# Patient Record
Sex: Female | Born: 1990 | Race: White | Hispanic: No | Marital: Single
Health system: Southern US, Community
[De-identification: ages and names within clinical notes are randomized; demographics above are authoritative.]

---

## 2011-05-08 ENCOUNTER — Emergency Department: Payer: Self-pay | Admitting: Emergency Medicine

## 2011-05-08 LAB — URINALYSIS, COMPLETE
Blood: NEGATIVE
Leukocyte Esterase: NEGATIVE
Nitrite: NEGATIVE
Ph: 5 (ref 4.5–8.0)
Protein: 30
Specific Gravity: 1.025 (ref 1.003–1.030)

## 2012-07-14 ENCOUNTER — Ambulatory Visit (INDEPENDENT_AMBULATORY_CARE_PROVIDER_SITE_OTHER): Payer: Self-pay | Admitting: Internal Medicine

## 2012-07-14 ENCOUNTER — Encounter: Payer: Self-pay | Admitting: Internal Medicine

## 2012-07-14 DIAGNOSIS — Z789 Other specified health status: Secondary | ICD-10-CM

## 2012-07-14 DIAGNOSIS — Z23 Encounter for immunization: Secondary | ICD-10-CM

## 2012-07-14 MED ORDER — ATOVAQUONE-PROGUANIL HCL 250-100 MG PO TABS: 1.0000 | ORAL_TABLET | Freq: Every day | ORAL | Status: AC

## 2012-07-14 MED ORDER — CIPROFLOXACIN HCL 500 MG PO TABS: 500.0000 mg | ORAL_TABLET | Freq: Two times a day (BID) | ORAL | Status: AC

## 2012-07-14 MED ORDER — TYPHOID VACCINE PO CPDR: 1.0000 | DELAYED_RELEASE_CAPSULE | ORAL | Status: AC

## 2012-07-14 NOTE — Progress Notes (Signed)
[facility] TRAVEL [facility] NOTE  RFV: travel trip for [place]  Subjective:    Patient ID: Diamond Goodwin, female    DOB: 02/18/1991, 22 y.o.   MRN: [id number]  HPI Diamond Goodwin going with her family to [place], from July 20th thru Aug 5th for family vacation. She is senior at [employer] business  All: [employer]  Meds: YAZ birth control  Pmhx: none   Imm: has had hep a, hep b, and typhoid, (expired on 2009), MMR, dpt 2007, meningococcal 2009, only 4 doses of OPV  Prior travel hx: lived in [facility] for 10-12 yr, [place], [place], [address], [place], [place], [place], [place], [place], [place], [place], [place], [place], [place], [place], [place], [place], [place], [place], [place].        Review of Systems     Objective:   Physical Exam        Assessment & Plan:  Provided travel counseling plus:  Traveler's diarrhea = give rx for cipro 500mg  BID(#20) NR  Malaria proph = malarone #26 tabs to start July 18th take daily until 1 wk return  Pre travel vaccinations = she will take the oral vivotif and also receive Yellow fever today in clinic

## 2013-05-16 IMAGING — CR DG RIBS 2V*L*
1 series · 4 of 4 positions shown · non-contrast
Comparison: none

REASON FOR EXAM: pain.
COMMENTS:

PROCEDURE:     DXR - DXR RIBS LEFT UNILATERAL  - May 08, 2011  [DATE]
RESULT:     Comparison: None

[Series 1: ap · 0.17mm/px · 4 of 4 slices shown]
[im 1/4]
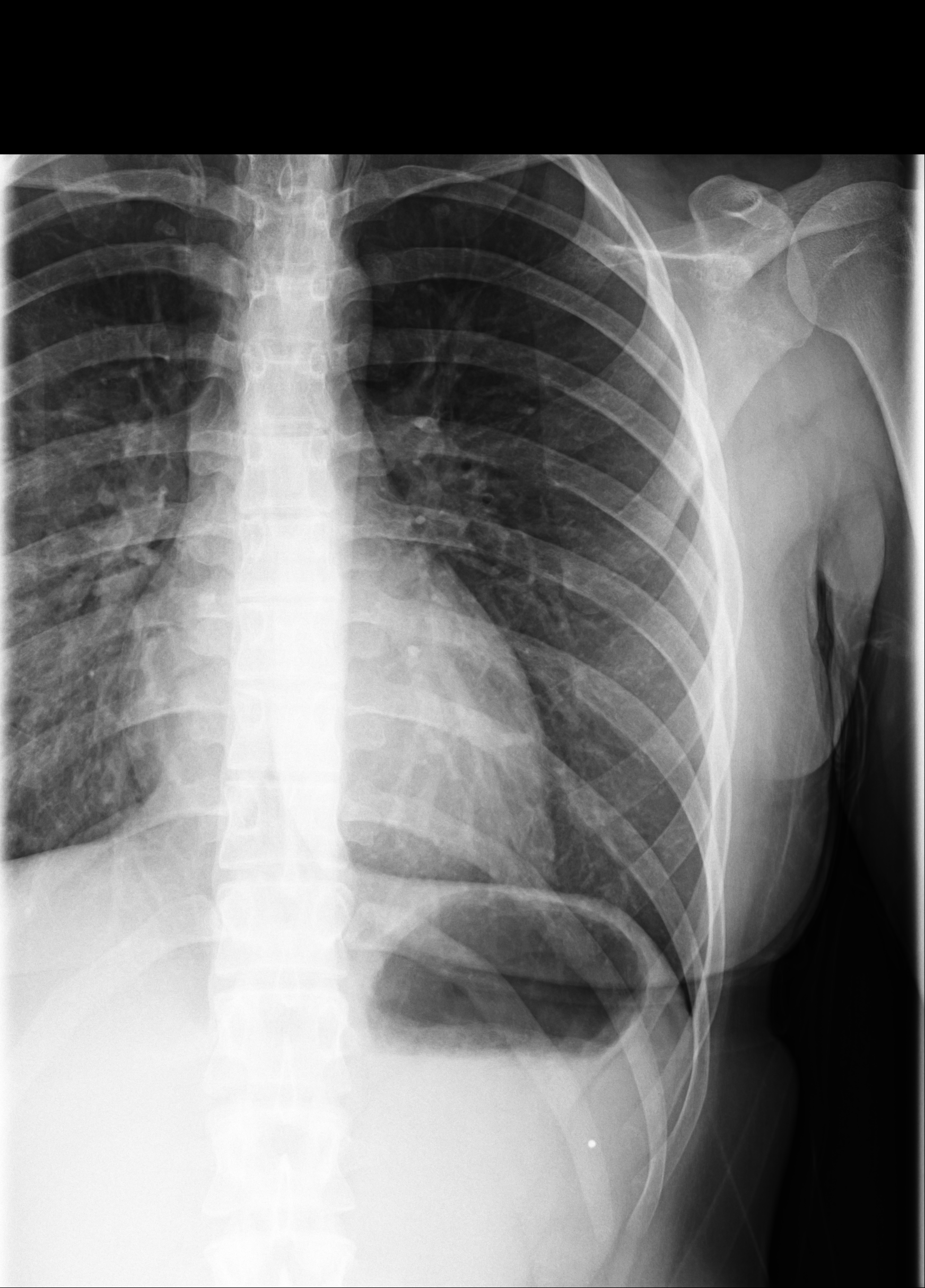
[im 2/4]
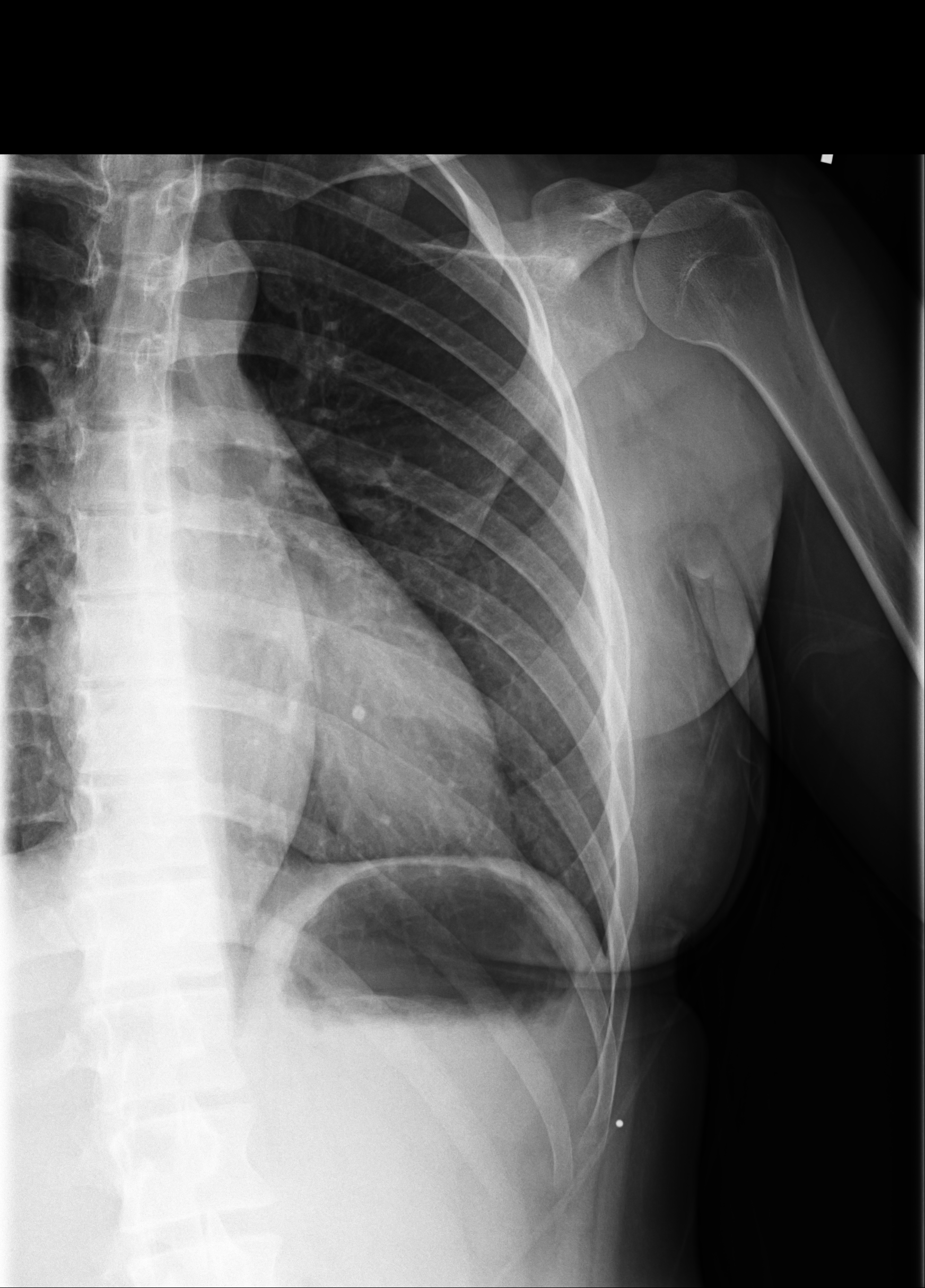
[im 3/4]
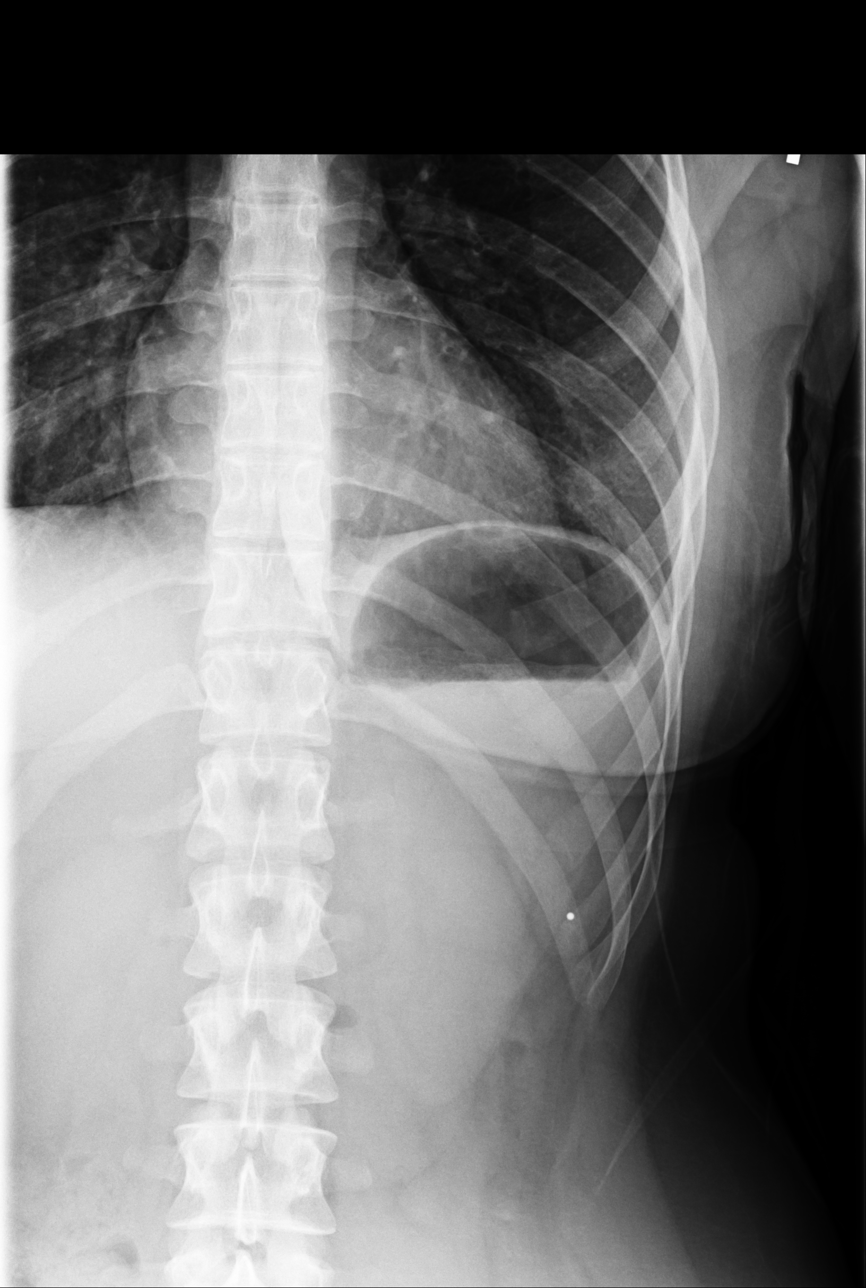
[im 4/4]
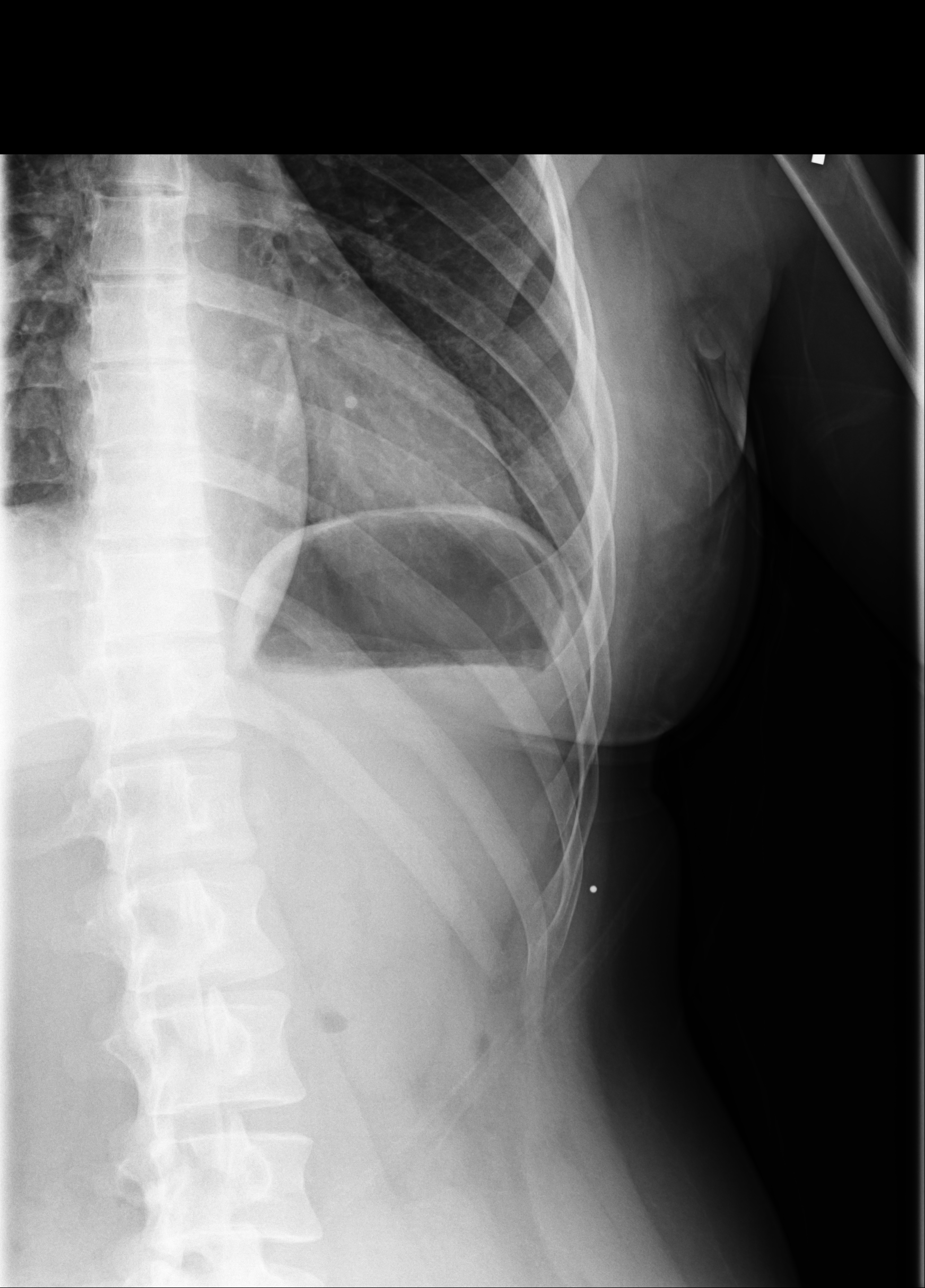

[4 of 4 positions shown; findings below may reference images not displayed]

FINDINGS: 4 views of the left ribs demonstrates no rib fracture or rib
deformity. There is no pleural reaction.
IMPRESSION: No acute osseous injury of the left ribs.

## 2018-02-28 DIAGNOSIS — M545 Low back pain: Secondary | ICD-10-CM | POA: Diagnosis not present

## 2018-02-28 DIAGNOSIS — M79606 Pain in leg, unspecified: Secondary | ICD-10-CM | POA: Diagnosis not present

## 2018-03-21 DIAGNOSIS — M545 Low back pain: Secondary | ICD-10-CM | POA: Diagnosis not present

## 2018-03-21 DIAGNOSIS — M79606 Pain in leg, unspecified: Secondary | ICD-10-CM | POA: Diagnosis not present

## 2018-03-28 DIAGNOSIS — M79606 Pain in leg, unspecified: Secondary | ICD-10-CM | POA: Diagnosis not present

## 2018-03-28 DIAGNOSIS — M545 Low back pain: Secondary | ICD-10-CM | POA: Diagnosis not present

## 2018-08-07 DIAGNOSIS — M25559 Pain in unspecified hip: Secondary | ICD-10-CM | POA: Diagnosis not present

## 2018-08-07 DIAGNOSIS — M79662 Pain in left lower leg: Secondary | ICD-10-CM | POA: Diagnosis not present

## 2018-08-07 DIAGNOSIS — M79661 Pain in right lower leg: Secondary | ICD-10-CM | POA: Diagnosis not present

## 2018-09-27 DIAGNOSIS — M79662 Pain in left lower leg: Secondary | ICD-10-CM | POA: Diagnosis not present

## 2018-09-27 DIAGNOSIS — M25559 Pain in unspecified hip: Secondary | ICD-10-CM | POA: Diagnosis not present

## 2018-09-27 DIAGNOSIS — M79661 Pain in right lower leg: Secondary | ICD-10-CM | POA: Diagnosis not present

## 2019-01-02 DIAGNOSIS — Z01419 Encounter for gynecological examination (general) (routine) without abnormal findings: Secondary | ICD-10-CM | POA: Diagnosis not present

## 2019-01-02 DIAGNOSIS — Z309 Encounter for contraceptive management, unspecified: Secondary | ICD-10-CM | POA: Diagnosis not present

## 2019-01-02 DIAGNOSIS — L659 Nonscarring hair loss, unspecified: Secondary | ICD-10-CM | POA: Diagnosis not present

## 2019-02-01 DIAGNOSIS — M545 Low back pain: Secondary | ICD-10-CM | POA: Diagnosis not present

## 2019-02-01 DIAGNOSIS — M25559 Pain in unspecified hip: Secondary | ICD-10-CM | POA: Diagnosis not present

## 2019-02-06 DIAGNOSIS — M25559 Pain in unspecified hip: Secondary | ICD-10-CM | POA: Diagnosis not present

## 2019-02-06 DIAGNOSIS — M545 Low back pain: Secondary | ICD-10-CM | POA: Diagnosis not present

## 2019-02-13 DIAGNOSIS — L659 Nonscarring hair loss, unspecified: Secondary | ICD-10-CM | POA: Diagnosis not present

## 2019-02-13 DIAGNOSIS — R7989 Other specified abnormal findings of blood chemistry: Secondary | ICD-10-CM | POA: Diagnosis not present

## 2019-03-06 ENCOUNTER — Other Ambulatory Visit: Payer: Self-pay | Admitting: Obstetrics & Gynecology

## 2019-03-07 ENCOUNTER — Other Ambulatory Visit: Payer: Self-pay | Admitting: Obstetrics & Gynecology

## 2019-03-07 DIAGNOSIS — R7989 Other specified abnormal findings of blood chemistry: Secondary | ICD-10-CM

## 2019-04-19 ENCOUNTER — Other Ambulatory Visit: Payer: Self-pay | Admitting: Obstetrics & Gynecology

## 2019-04-20 ENCOUNTER — Ambulatory Visit
Admission: RE | Admit: 2019-04-20 | Discharge: 2019-04-20 | Disposition: A | Discharge status: Home / Self Care | Payer: BC Managed Care – PPO | Source: Ambulatory Visit | Attending: Obstetrics & Gynecology | Admitting: Obstetrics & Gynecology

## 2019-04-20 DIAGNOSIS — Z0389 Encounter for observation for other suspected diseases and conditions ruled out: Secondary | ICD-10-CM | POA: Diagnosis not present

## 2019-04-20 DIAGNOSIS — R7989 Other specified abnormal findings of blood chemistry: Secondary | ICD-10-CM

## 2019-04-20 DIAGNOSIS — R935 Abnormal findings on diagnostic imaging of other abdominal regions, including retroperitoneum: Secondary | ICD-10-CM | POA: Diagnosis not present

## 2019-04-20 MED ORDER — IOPAMIDOL (ISOVUE-300) INJECTION 61%
100.0000 mL | Freq: Once | INTRAVENOUS | Status: AC | PRN
  Administered 2019-04-20: 14:00:00 100 mL via INTRAVENOUS

## 2019-05-23 DIAGNOSIS — J029 Acute pharyngitis, unspecified: Secondary | ICD-10-CM | POA: Diagnosis not present

## 2019-05-23 DIAGNOSIS — Z20828 Contact with and (suspected) exposure to other viral communicable diseases: Secondary | ICD-10-CM | POA: Diagnosis not present

## 2019-05-24 ENCOUNTER — Ambulatory Visit: Payer: BC Managed Care – PPO | Attending: Family

## 2019-05-24 DIAGNOSIS — R635 Abnormal weight gain: Secondary | ICD-10-CM | POA: Diagnosis not present

## 2019-05-24 DIAGNOSIS — Z23 Encounter for immunization: Secondary | ICD-10-CM

## 2019-05-24 DIAGNOSIS — L659 Nonscarring hair loss, unspecified: Secondary | ICD-10-CM | POA: Diagnosis not present

## 2019-05-24 NOTE — Progress Notes (Signed)
   Covid-19 Vaccination Clinic  Name:  Diamond Goodwin    MRN: [id number] DOB: [birth year]  05/24/2019  [name] was observed post Covid-19 immunization for 15 minutes without incident. She was provided with Vaccine Information Sheet and instruction to access the V-Safe system.   [name] was instructed to call 911 with any severe reactions post vaccine: Marland Kitchen Difficulty breathing  . Swelling of face and throat  . A fast heartbeat  . A bad rash all over body  . Dizziness and weakness   Immunizations Administered    Name Date Dose VIS Date Route   Moderna COVID-19 Vaccine 05/24/2019  3:12 PM 0.5 mL 01/23/2019 Intramuscular   Manufacturer: Moderna   Lot: [id number]   NDC: 14782-956-21

## 2019-05-29 DIAGNOSIS — M25561 Pain in right knee: Secondary | ICD-10-CM | POA: Diagnosis not present

## 2019-05-29 DIAGNOSIS — M25551 Pain in right hip: Secondary | ICD-10-CM | POA: Diagnosis not present

## 2019-05-29 DIAGNOSIS — M25552 Pain in left hip: Secondary | ICD-10-CM | POA: Diagnosis not present

## 2019-06-12 DIAGNOSIS — M25551 Pain in right hip: Secondary | ICD-10-CM | POA: Diagnosis not present

## 2019-06-12 DIAGNOSIS — M25561 Pain in right knee: Secondary | ICD-10-CM | POA: Diagnosis not present

## 2019-06-12 DIAGNOSIS — M25552 Pain in left hip: Secondary | ICD-10-CM | POA: Diagnosis not present

## 2019-06-26 ENCOUNTER — Ambulatory Visit: Payer: BC Managed Care – PPO | Attending: Family

## 2019-06-26 DIAGNOSIS — Z23 Encounter for immunization: Secondary | ICD-10-CM

## 2019-06-26 NOTE — Progress Notes (Signed)
   Covid-19 Vaccination Clinic  Name:  Diamond Goodwin    MRN: [id number] DOB: 1990/12/04  06/26/2019  Diamond Goodwin was observed post Covid-19 immunization for 15 minutes without incident. She was provided with Vaccine Information Sheet and instruction to access the V-Safe system.   Diamond Goodwin was instructed to call 911 with any severe reactions post vaccine: Marland Kitchen Difficulty breathing  . Swelling of face and throat  . A fast heartbeat  . A bad rash all over body  . Dizziness and weakness   Immunizations Administered    Name Date Dose VIS Date Route   Moderna COVID-19 Vaccine 06/26/2019  3:04 PM 0.5 mL 01/2019 Intramuscular   Manufacturer: Moderna   Lot: [id number]   NDC: 30092-330-07

## 2019-10-02 DIAGNOSIS — M25561 Pain in right knee: Secondary | ICD-10-CM | POA: Diagnosis not present

## 2019-10-02 DIAGNOSIS — M25552 Pain in left hip: Secondary | ICD-10-CM | POA: Diagnosis not present

## 2019-10-02 DIAGNOSIS — M25551 Pain in right hip: Secondary | ICD-10-CM | POA: Diagnosis not present

## 2019-10-18 DIAGNOSIS — D225 Melanocytic nevi of trunk: Secondary | ICD-10-CM | POA: Diagnosis not present

## 2019-10-18 DIAGNOSIS — D2270 Melanocytic nevi of unspecified lower limb, including hip: Secondary | ICD-10-CM | POA: Diagnosis not present

## 2019-10-18 DIAGNOSIS — D2360 Other benign neoplasm of skin of unspecified upper limb, including shoulder: Secondary | ICD-10-CM | POA: Diagnosis not present

## 2019-10-18 DIAGNOSIS — L821 Other seborrheic keratosis: Secondary | ICD-10-CM | POA: Diagnosis not present

## 2020-01-11 DIAGNOSIS — M25552 Pain in left hip: Secondary | ICD-10-CM | POA: Diagnosis not present

## 2020-01-11 DIAGNOSIS — M25551 Pain in right hip: Secondary | ICD-10-CM | POA: Diagnosis not present

## 2020-01-11 DIAGNOSIS — M25561 Pain in right knee: Secondary | ICD-10-CM | POA: Diagnosis not present

## 2021-04-20 DIAGNOSIS — J019 Acute sinusitis, unspecified: Secondary | ICD-10-CM | POA: Diagnosis not present

## 2021-04-28 IMAGING — CT CT ABD-PEL WO/W CM
3 of 10 series · 13 of 46 positions shown, 14 images · IV contrast (iopamidol)
Comparison: None.

CLINICAL DATA: Possible adrenal mass, hair thinning, high
testosterone

EXAM:
CT ABDOMEN AND PELVIS WITHOUT AND WITH CONTRAST
TECHNIQUE: Multidetector CT imaging of the abdomen and pelvis was performed
following the standard protocol before and following the bolus
administration of intravenous contrast.
CONTRAST:  100mL [id number] IOPAMIDOL ([id number]) INJECTION 61%

[Series 7: adrenals portal venous 3.00 br40 s3 axial cm · axial · portal-venous · 0.53mm/px · z∈[+1129,+1501]mm · 8 of 160 slices shown, 9 images]
[im 18/160  soft-tissue]
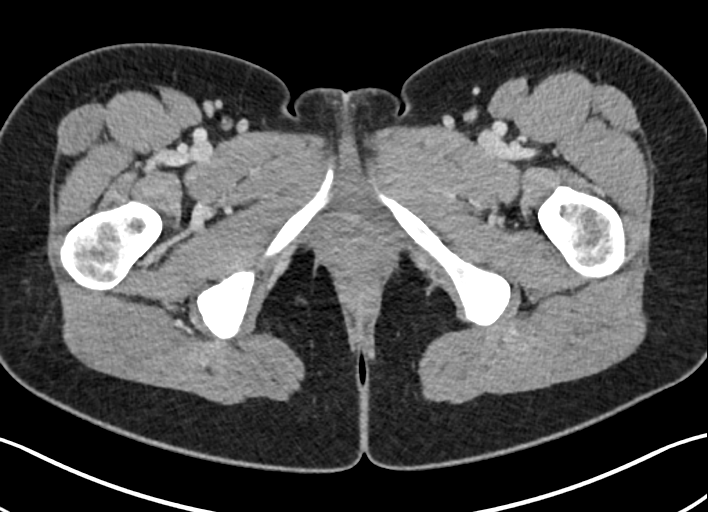
[im 18/160  bone]
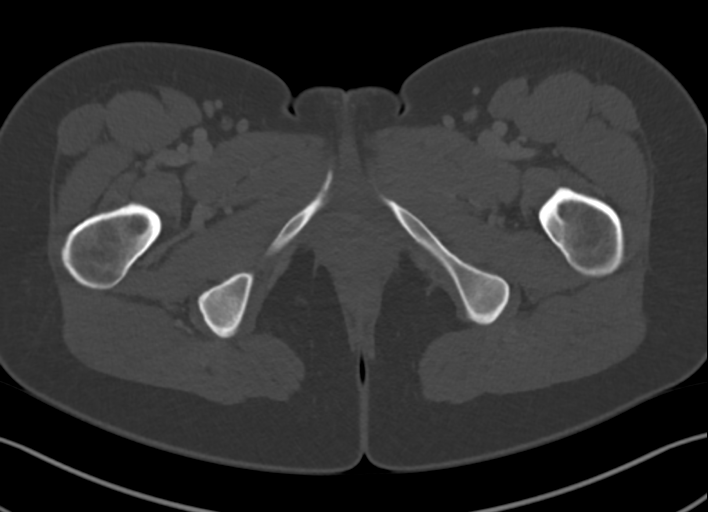
[im 36/160  soft-tissue]
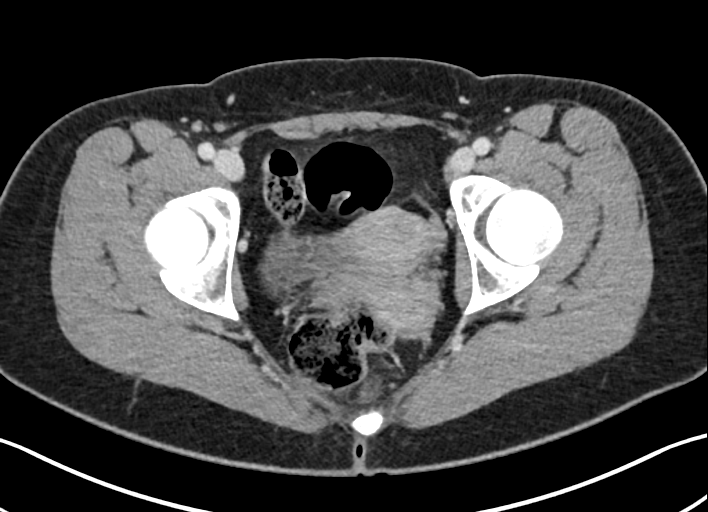
[im 54/160  soft-tissue]
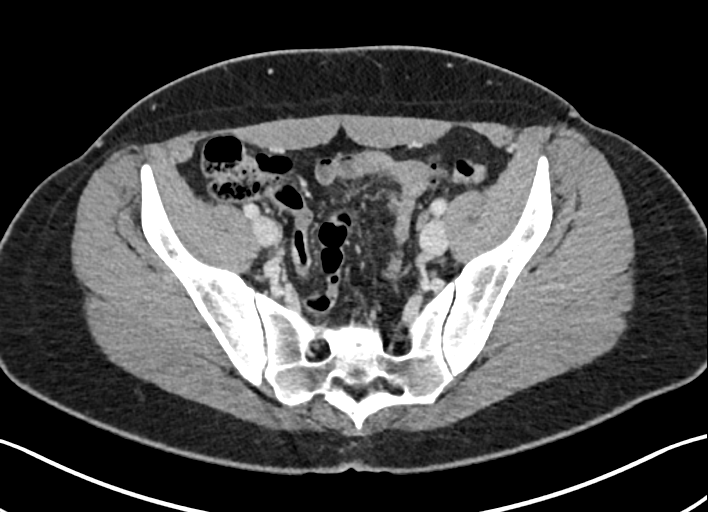
[im 71/160  soft-tissue]
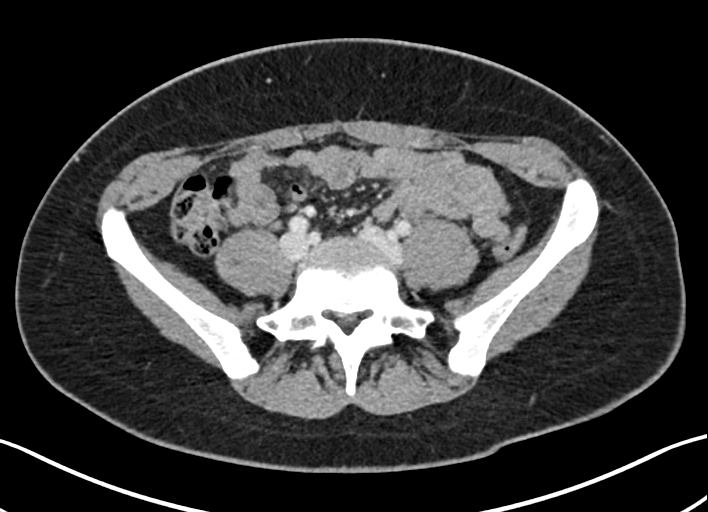
[im 89/160  soft-tissue]
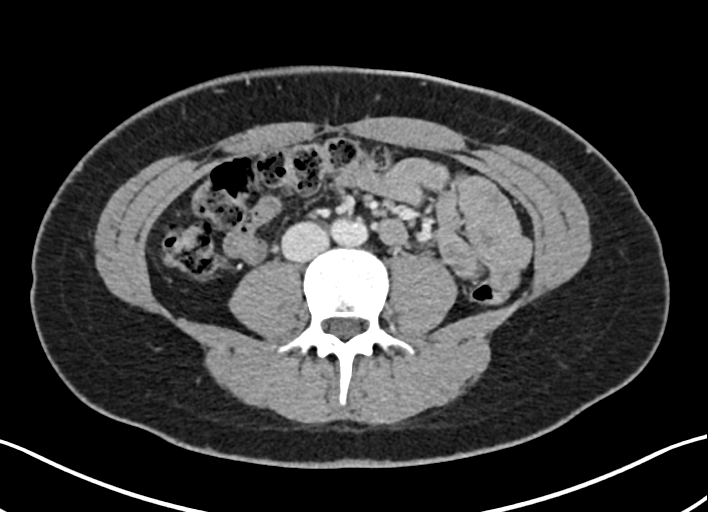
[im 107/160  soft-tissue]
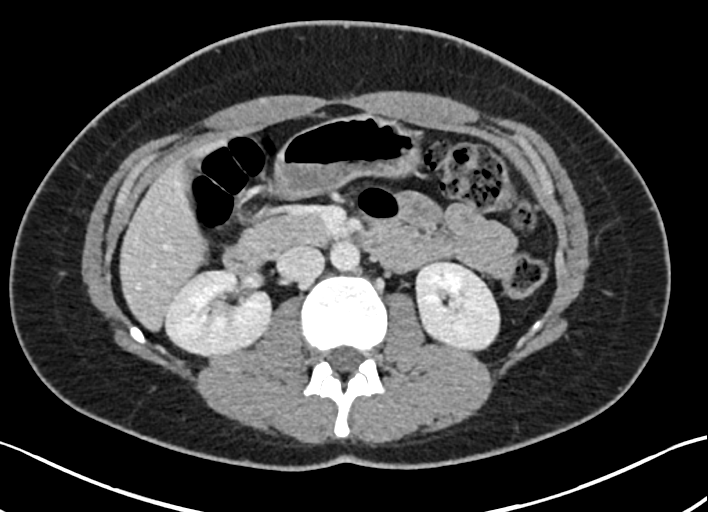
[im 124/160  soft-tissue]
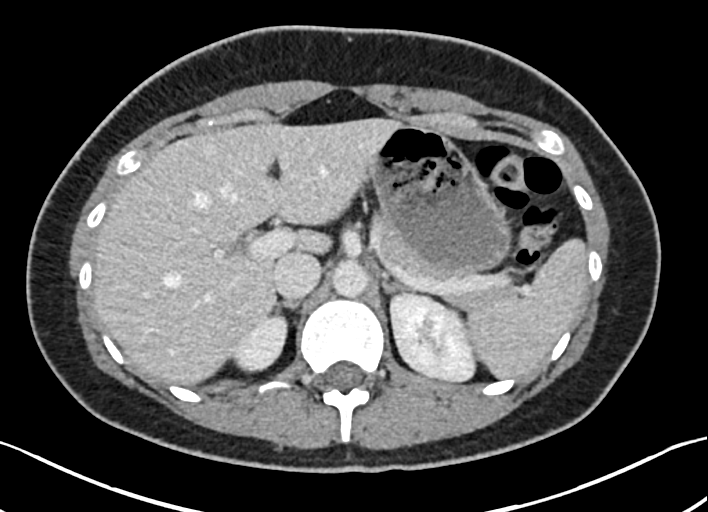
[im 142/160  soft-tissue]
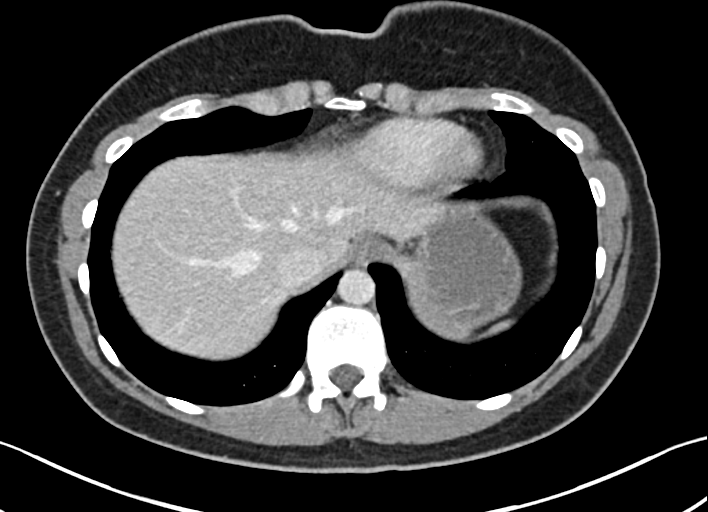

[Series 9: adrenals portal venous 3.00 br40 s3 cor cm · coronal · portal-venous · 0.73mm/px · 3 of 89 slices shown]
[im 23/89  soft-tissue]
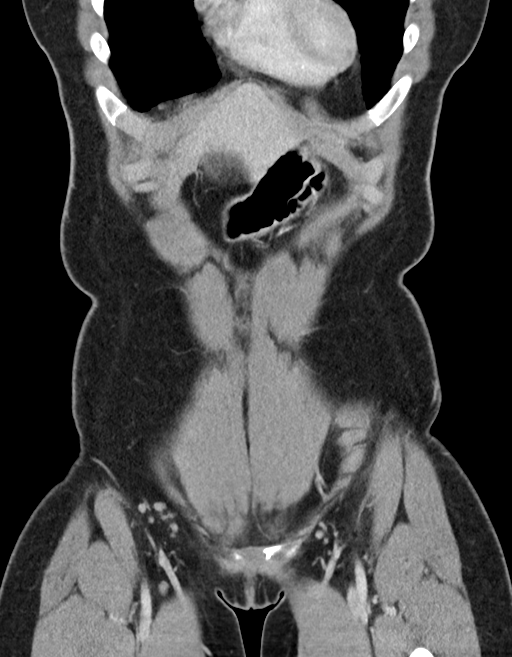
[im 45/89  soft-tissue]
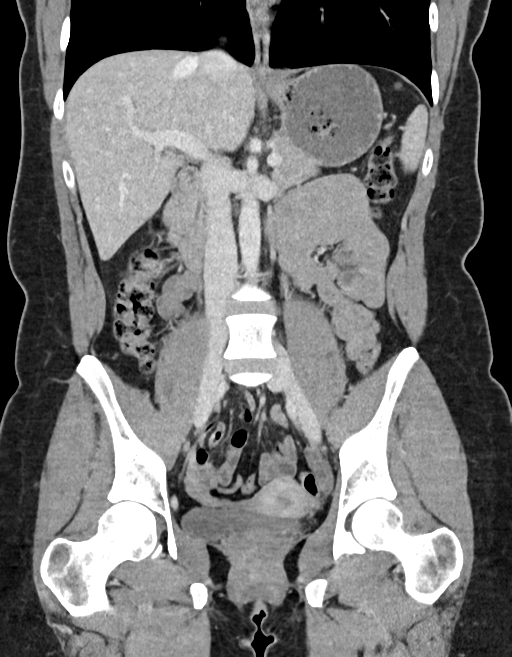
[im 67/89  soft-tissue]
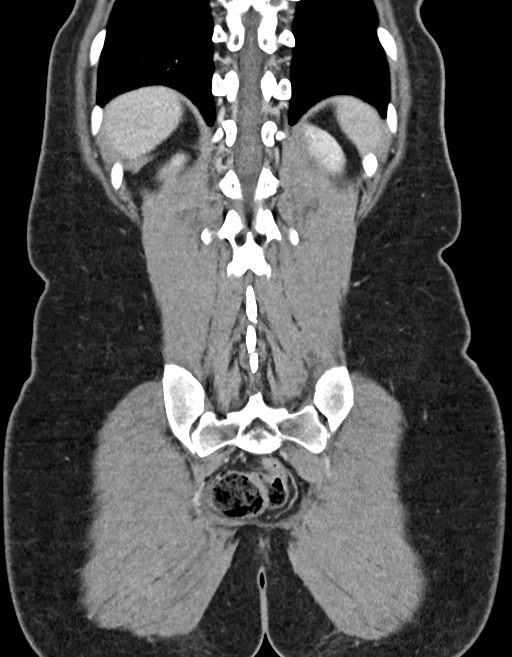

[Series 13: adrenals portal venous 5.00 br60 s3 axial lung cm · axial · portal-venous · 0.53mm/px · z∈[+1174,+1269]mm · 2 of 96 slices shown]
[im 20/96  bone]
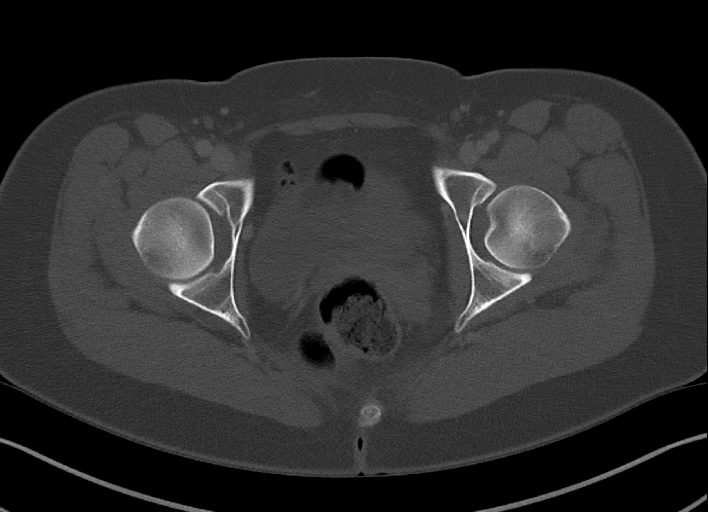
[im 39/96  bone]
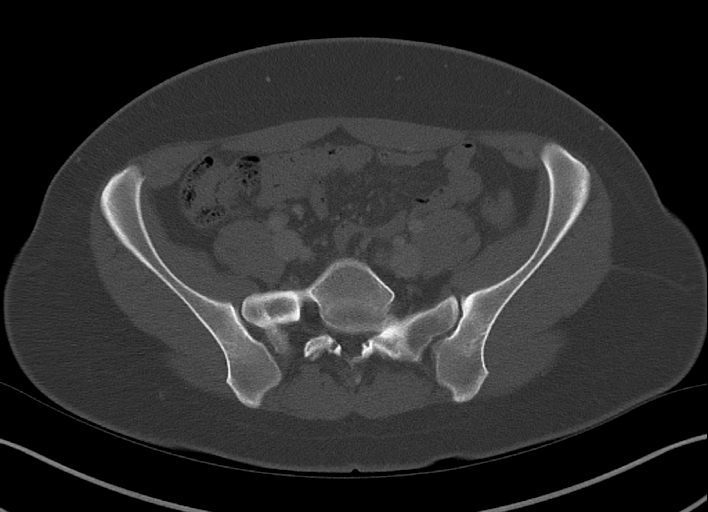

[13 of 46 positions shown; findings below may reference images not displayed]

FINDINGS: Lower chest: No acute abnormality.

Hepatobiliary: No solid liver abnormality is seen. No gallstones,
gallbladder wall thickening, or biliary dilatation.

Pancreas: Unremarkable. No pancreatic ductal dilatation or
surrounding inflammatory changes.

Spleen: Normal in size without significant abnormality.

Adrenals/Urinary Tract: Adrenal glands are unremarkable. Kidneys are
normal, without renal calculi, solid lesion, or hydronephrosis.
Bladder is unremarkable.

Stomach/Bowel: Stomach is within normal limits. Appendix appears
normal. No evidence of bowel wall thickening, distention, or
inflammatory changes.

Vascular/Lymphatic: No significant vascular findings are present. No
enlarged abdominal or pelvic lymph nodes.

Reproductive: No mass or other significant abnormality.

Other: No abdominal wall hernia or abnormality. No abdominopelvic
ascites.

Musculoskeletal: No acute or significant osseous findings.
Incidental note of partial sacralization of the right aspect of L5.
IMPRESSION: No evidence of adrenal mass or nodule.
